# Patient Record
Sex: Female | Born: 1975 | Hispanic: Yes | Smoking: Former smoker
Health system: Southern US, Community
[De-identification: ages and names within clinical notes are randomized; demographics above are authoritative.]

## PROBLEM LIST (undated history)

## (undated) DIAGNOSIS — D649 Anemia, unspecified: Secondary | ICD-10-CM

## (undated) HISTORY — PX: TUBAL LIGATION: SHX77

---

## 2017-12-16 ENCOUNTER — Other Ambulatory Visit: Payer: Self-pay | Admitting: Obstetrics and Gynecology

## 2017-12-16 DIAGNOSIS — Z1231 Encounter for screening mammogram for malignant neoplasm of breast: Secondary | ICD-10-CM

## 2018-02-02 ENCOUNTER — Encounter (HOSPITAL_COMMUNITY): Payer: Self-pay | Admitting: *Deleted

## 2018-02-02 ENCOUNTER — Ambulatory Visit
Admission: RE | Admit: 2018-02-02 | Discharge: 2018-02-02 | Disposition: A | Discharge status: Home / Self Care | Payer: No Typology Code available for payment source | Source: Ambulatory Visit | Attending: Obstetrics and Gynecology | Admitting: Obstetrics and Gynecology

## 2018-02-02 ENCOUNTER — Ambulatory Visit (HOSPITAL_COMMUNITY)
Admission: RE | Admit: 2018-02-02 | Discharge: 2018-02-02 | Disposition: A | Discharge status: Home / Self Care | Payer: Self-pay | Source: Ambulatory Visit | Attending: Obstetrics and Gynecology | Admitting: Obstetrics and Gynecology

## 2018-02-02 VITALS — BP 122/84 | Wt 170.8 lb

## 2018-02-02 DIAGNOSIS — Z1231 Encounter for screening mammogram for malignant neoplasm of breast: Secondary | ICD-10-CM

## 2018-02-02 DIAGNOSIS — Z1239 Encounter for other screening for malignant neoplasm of breast: Secondary | ICD-10-CM

## 2018-02-02 HISTORY — DX: Anemia, unspecified: D64.9

## 2018-02-02 NOTE — Patient Instructions (Signed)
Explained breast self awareness with [name]. Patient did not need a Pap smear today due to last Pap smear was in April 2018 per patient. Let her know [facility]CP will cover Pap smears every 3 years unless has a history of abnormal Pap smears. Referred patient to the [facility] Center of [facility] for a screening mammogram. Appointment scheduled for Thursday, February 02, 2018 at 1110. Let patient know the Breast Center will follow up with her within the next couple weeks with results of mammogram by letter or phone. Discussed smoking cessation with patient. Referred to the [facility] Quitline and gave resources to the free smoking cessation classes at [facility]. [name] verbalized understanding.  Brannock, Kathaleen Maser, RN 10:32 AM

## 2018-02-02 NOTE — Progress Notes (Signed)
No complaints today.   Pap Smear: Pap smear not completed today. Last Pap smear was in April 2018 in [place] and normal per patient. Per patient has no history of an abnormal Pap smear. No Pap smear results are in Epic.  Physical exam: Breasts Breasts symmetrical. No skin abnormalities bilateral breasts. No nipple retraction bilateral breasts. No nipple discharge bilateral breasts. No lymphadenopathy. No lumps palpated bilateral breasts. No complaints of pain or tenderness on exam. Referred patient to the [facility] of [facility] for a screening mammogram. Appointment scheduled for Thursday, February 02, 2018 at 1110.        Pelvic/Bimanual No Pap smear completed today since last Pap smear was in April 2018 per patient. Pap smear not indicated per BCCCP guidelines.   Smoking History: Patient is a current smoker. Discussed smoking cessation with patient. Referred to the [facility] Quitline and gave resources to the free smoking cessation classes at [facility].  Patient Navigation: Patient education provided. Access to services provided for patient through [facility] program. Spanish interpreter provided.   Breast and Cervical Cancer Risk Assessment: Patient has no family history of breast cancer, known genetic mutations, or radiation treatment to the chest before age 84. Patient has no history of cervical dysplasia, immunocompromised, or DES exposure in-utero.  Risk Assessment    Risk Scores      02/02/2018   Last edited by: Lynnell Dike, LPN   5-year risk: 0.3 %   Lifetime risk: 5.1 %         Used Spanish interpreter Natale Lay from [facility]

## 2018-02-03 ENCOUNTER — Encounter (HOSPITAL_COMMUNITY): Payer: Self-pay | Admitting: *Deleted

## 2018-07-24 ENCOUNTER — Other Ambulatory Visit: Payer: Self-pay | Admitting: Medical

## 2018-07-24 DIAGNOSIS — Z124 Encounter for screening for malignant neoplasm of cervix: Secondary | ICD-10-CM

## 2018-07-24 NOTE — Progress Notes (Signed)
Patient: Dominique Henson           Date of Birth: 05-09-1976           MRN: [id number] Visit Date: 07/24/2018 PCP: Catalina Antigua, MD     Cervical Exam  Abnormal Observations: No abnormal finding. Recommendations: Await Pap smear results. Follow up as indicated.      Patient's History There are no active problems to display for this patient.  Past Medical History:  Diagnosis Date  . Anemia     Family History  Problem Relation Age of Onset  . Hypertension Father   . Diabetes Maternal Aunt   . Diabetes Maternal Uncle   . Diabetes Maternal Grandmother   . Diabetes Maternal Uncle     Past Surgical History:  Procedure Laterality Date  . TUBAL LIGATION     Social History   Occupational History  . Not on file  Tobacco Use  . Smoking status: Current Every Day Smoker  . Smokeless tobacco: Never Used  Substance and Sexual Activity  . Alcohol use: Not Currently  . Drug use: Never  . Sexual activity: Yes    Birth control/protection: Surgical

## 2018-07-24 NOTE — Addendum Note (Signed)
Addended by: Priscille Heidelberg on: 07/24/2018 08:24 PM   Modules accepted: Orders

## 2018-07-27 LAB — CYTOLOGY - PAP: DIAGNOSIS: NEGATIVE

## 2018-08-28 ENCOUNTER — Telehealth (HOSPITAL_COMMUNITY): Payer: Self-pay | Admitting: *Deleted

## 2018-08-28 NOTE — Telephone Encounter (Signed)
Normal Pap smear result letter mailed to patient by Cytology. 

## 2020-03-05 ENCOUNTER — Other Ambulatory Visit: Payer: Self-pay | Admitting: Obstetrics and Gynecology

## 2020-03-05 DIAGNOSIS — Z1231 Encounter for screening mammogram for malignant neoplasm of breast: Secondary | ICD-10-CM

## 2020-04-22 ENCOUNTER — Ambulatory Visit: Payer: No Typology Code available for payment source | Admitting: *Deleted

## 2020-04-22 ENCOUNTER — Other Ambulatory Visit: Payer: Self-pay

## 2020-04-22 ENCOUNTER — Ambulatory Visit
Admission: RE | Admit: 2020-04-22 | Discharge: 2020-04-22 | Disposition: A | Discharge status: Home / Self Care | Payer: No Typology Code available for payment source | Source: Ambulatory Visit | Attending: Obstetrics and Gynecology | Admitting: Obstetrics and Gynecology

## 2020-04-22 VITALS — BP 142/80 | Wt 192.5 lb

## 2020-04-22 DIAGNOSIS — Z1239 Encounter for other screening for malignant neoplasm of breast: Secondary | ICD-10-CM

## 2020-04-22 DIAGNOSIS — Z1231 Encounter for screening mammogram for malignant neoplasm of breast: Secondary | ICD-10-CM

## 2020-04-22 NOTE — Progress Notes (Signed)
[name] is a 44 y.o. female who presents to [facility] clinic today with no complaints.    Pap Smear: Pap not smear completed today. Last Pap smear was  07/24/2018 at the free cervical cancer screening at [facility] and was normal. Per patient has no history of an abnormal Pap smear. Last Pap smear result is available in Epic.   Physical exam: Breasts Breasts symmetrical. No skin abnormalities bilateral breasts. No nipple retraction bilateral breasts. No nipple discharge bilateral breasts. No lymphadenopathy. No lumps palpated bilateral breasts. Complaints of left nipple area tenderness on exam. Patient stated he menstrual period is due in five days.       Pelvic/Bimanual Pap is not indicated today per BCCCP guidelines.   Smoking History: Patient is a former smoker that quit in April 2020.   Patient Navigation: Patient education provided. Access to services provided for patient through [place] program. Spanish interpreter Natale Lay from [facility] provided.    Breast and Cervical Cancer Risk Assessment: Patient has family history of a paternal aunt having breast cancer. Patient has no known genetic mutations, or radiation treatment to the chest before age 13. Patient does not have history of cervical dysplasia, immunocompromised, or DES exposure in-utero.  Risk Assessment    Risk Scores      04/22/2020 02/02/2018   Last edited by: Meryl Dare, CMA Stoney Bang H, LPN   5-year risk: 0.4 % 0.3 %   Lifetime risk: 5 % 5.1 %          A: BCCCP exam without pap smear No complaints.  P: Referred patient to the [facility] Center of [facility] for a screening mammogram on the mobile unit. Appointment scheduled Tuesday, April 22, 2020 at 1500.  Priscille Heidelberg, RN 04/22/2020 2:25 PM

## 2020-04-22 NOTE — Patient Instructions (Addendum)
Explained breast self awareness with Dominique Henson. Patient did not need a Pap smear today due to last Pap smear was 07/24/2018. Let her know BCCCP will cover Pap smears every 3 years unless has a history of abnormal Pap smears. Referred patient to the [facility] of [facility] for a screening mammogram on the mobile unit. Appointment scheduled Tuesday, April 22, 2020 at 1500. Patient escorted to the mobile unit following BCCCP appointment for her screening mammogram. Let patient know the Breast Center will follow up with her within the next couple weeks with results of her mammogram by letter or phone. Dominique Henson verbalized understanding.  Saamir Armstrong, Kathaleen Maser, RN 2:26 PM

## 2020-06-11 ENCOUNTER — Ambulatory Visit: Payer: Self-pay

## 2020-06-11 ENCOUNTER — Other Ambulatory Visit: Payer: No Typology Code available for payment source

## 2020-07-30 ENCOUNTER — Other Ambulatory Visit: Payer: No Typology Code available for payment source

## 2020-07-30 ENCOUNTER — Ambulatory Visit: Payer: Self-pay

## 2022-04-01 IMAGING — MG DIGITAL SCREENING BILAT W/ TOMO W/ CAD
8 series · 8 of 24 positions shown · non-contrast
Comparison: Previous exam(s).

CLINICAL DATA: Screening.

EXAM:
DIGITAL SCREENING BILATERAL MAMMOGRAM WITH TOMO AND CAD

[R CC synth-2D]
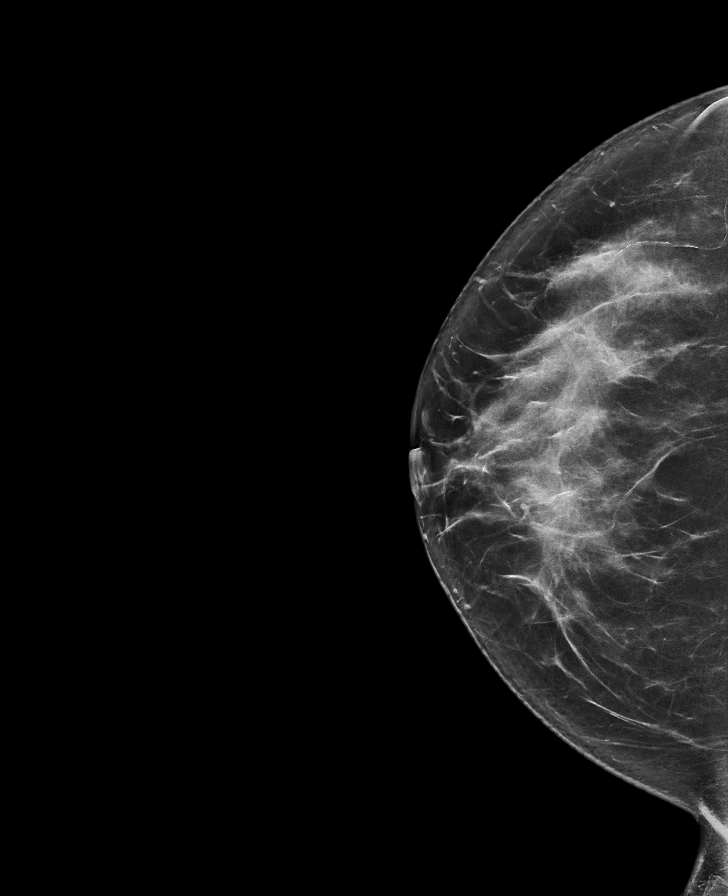

[L CC synth-2D]
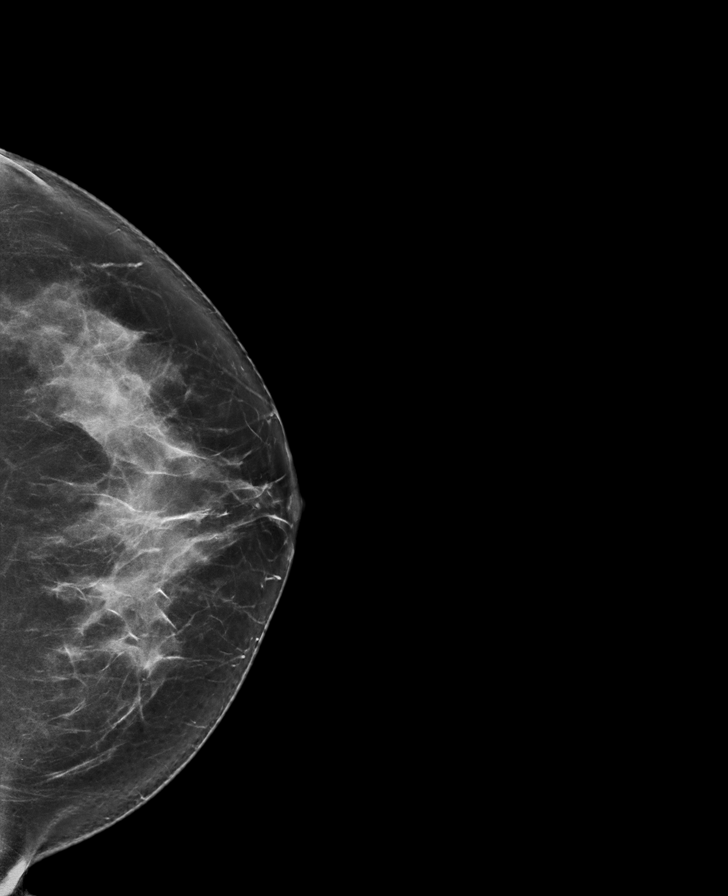

[R MLO synth-2D]
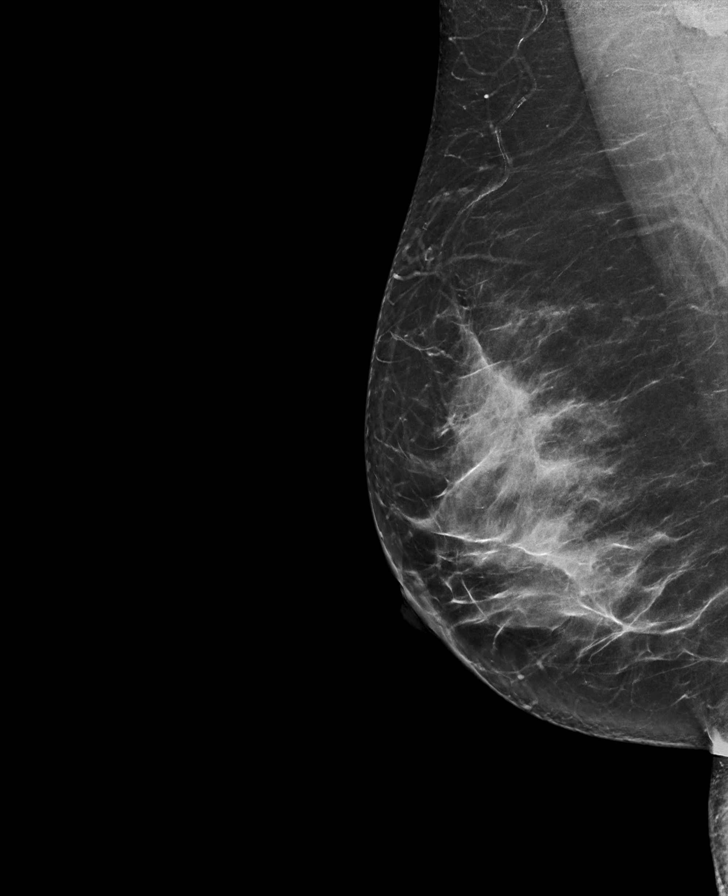

[L MLO synth-2D]
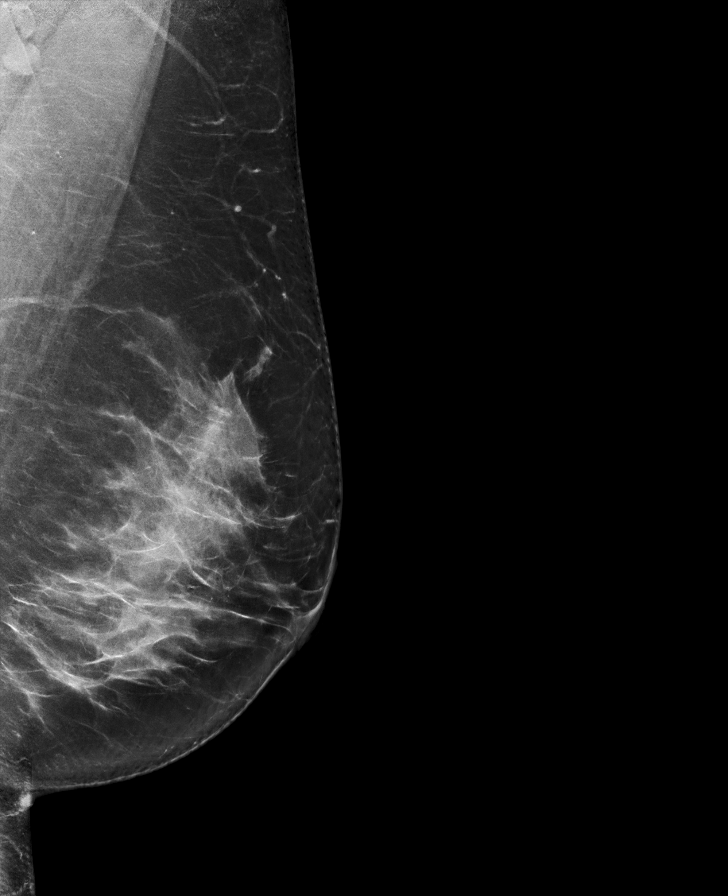

[R CC tomo · tomo slice 41/82.0]
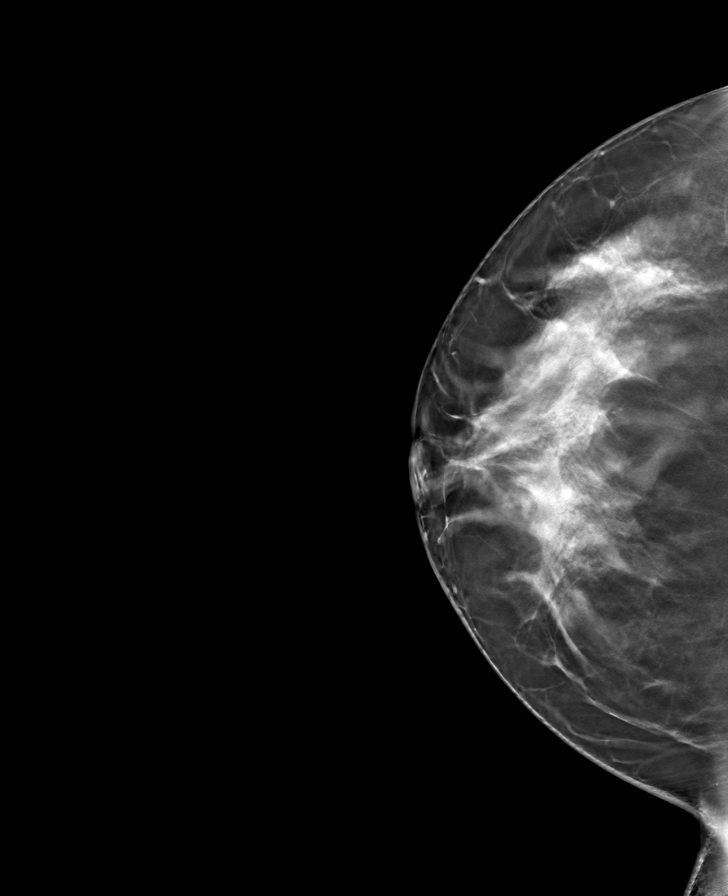

[L CC tomo · tomo slice 43/84.0]
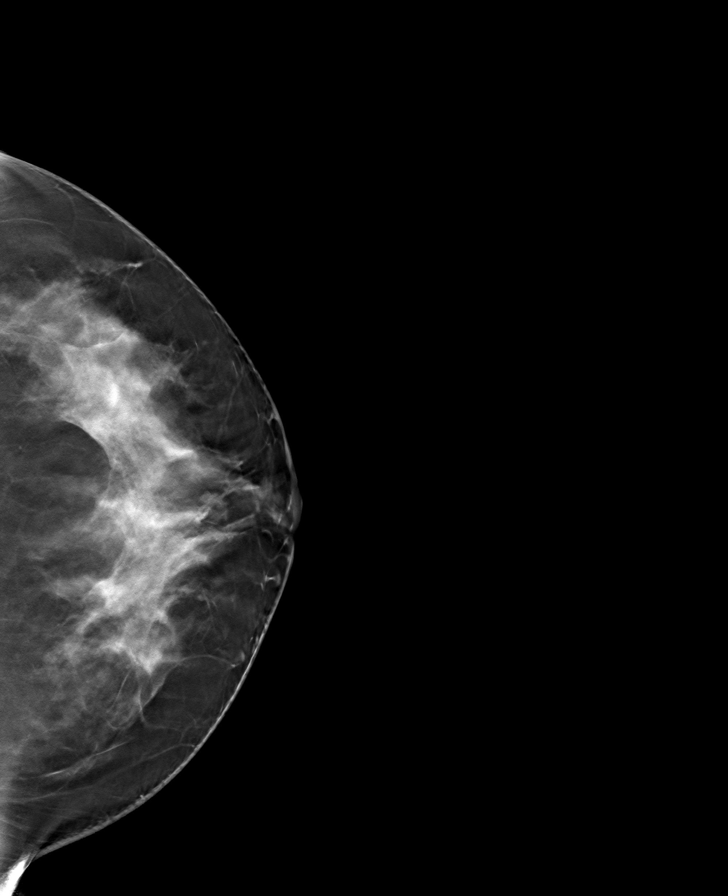

[L MLO tomo · tomo slice 45/89.0]
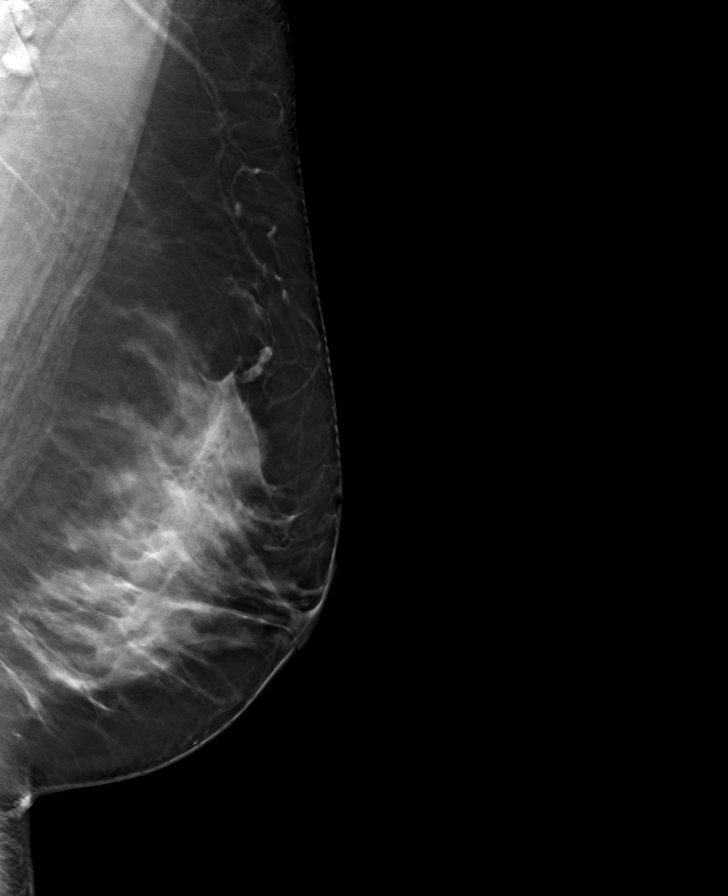

[R MLO tomo · tomo slice 42/83.0]
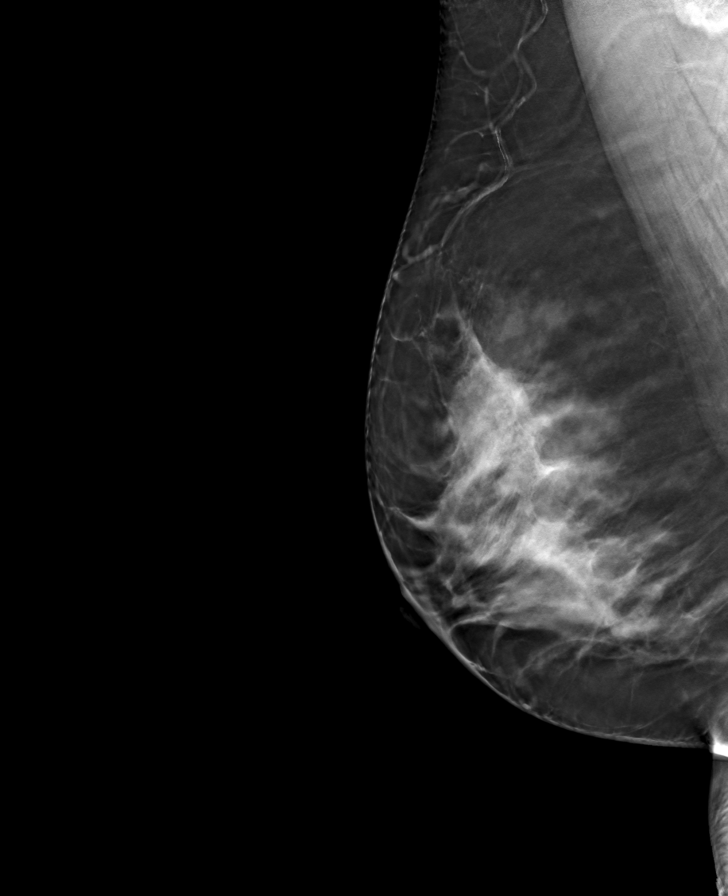

[8 of 24 positions shown; findings below may reference images not displayed]

ACR Breast Density Category c: The breast tissue is heterogeneously
dense, which may obscure small masses.
FINDINGS: There are no findings suspicious for malignancy. Images were
processed with CAD.
IMPRESSION: No mammographic evidence of malignancy. A result letter of this
screening mammogram will be mailed directly to the patient.

RECOMMENDATION:
Screening mammogram in one year. (Code:[id number]-[id number])

BI-RADS CATEGORY  1: Negative.
